# Patient Record
Sex: Female | Born: 2000 | Hispanic: Yes | Marital: Single | Smoking: Never smoker
Health system: Southern US, Community
[De-identification: ages and names within clinical notes are randomized; demographics above are authoritative.]

---
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
## 2023-09-18 ENCOUNTER — Encounter: Payer: Self-pay | Admitting: Adult Health

## 2023-09-21 ENCOUNTER — Ambulatory Visit (INDEPENDENT_AMBULATORY_CARE_PROVIDER_SITE_OTHER): Payer: Self-pay | Admitting: Oncology

## 2023-09-21 ENCOUNTER — Encounter: Payer: Self-pay | Admitting: Oncology

## 2023-09-21 ENCOUNTER — Other Ambulatory Visit: Payer: Self-pay

## 2023-09-21 VITALS — BP 122/66 | HR 103 | Temp 101.4°F | Resp 18 | Ht 68.0 in | Wt 210.0 lb

## 2023-09-21 DIAGNOSIS — Z23 Encounter for immunization: Secondary | ICD-10-CM

## 2023-09-21 DIAGNOSIS — Z029 Encounter for administrative examinations, unspecified: Secondary | ICD-10-CM

## 2023-09-21 NOTE — Progress Notes (Signed)
[facility] [address] [place], [place] [address] Phone: [phone] Fax: [phone]   Office Visit Note  Patient Name: Caitlin Warner  Date of [id number]  Med Rec number [id number]  Date of Service: 09/21/2023  Patient has no known allergies.  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   Not on any daily medication.   No recent hospitalization or surgeries.   NKDA.   No current concerns.   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 Wears braces.   Current Medication:  Outpatient Encounter Medications as of 09/21/2023  Medication Sig   ketoconazole (NIZORAL) 2 % shampoo Apply topically.   No facility-administered encounter medications on file as of 09/21/2023.   Medical History: History reviewed. No pertinent past medical history.   Vital Signs: BP 122/66   Pulse (!) 103   Temp (!) 101.4 F (38.6 C) (Tympanic)   Resp 18   Ht 5\' 8"  (1.727 m)   Wt 210 lb (95.3 kg)   SpO2 99%   BMI 31.93 kg/m   ROS: As per HPI.  All other pertinent ROS negative.     Review of Systems  Constitutional: Negative.   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   Physical Exam Constitutional:      General: She is not in acute distress.    Appearance: Normal appearance.  Neurological:     Mental Status: She is alert.     No results found for this or any previous visit (from the past 24 hour(s)).  Assessment/Plan: 1. Immunization due -Immunization due.  -Last when she was 12 per student.  -Tdap given in clinic. Discussed SE.   - Tdap vaccine greater than or equal to 7yo IM  2. Encounter for administrative examinations -No current concerns.  -No current medications.  - Paperwork filled out and will be scanned into EPIC.  3. 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   - ketoconazole (NIZORAL) 2 % shampoo; Apply topically.   General Counseling: makaria demain understanding of the findings of todays visit and agrees with plan of treatment. I have discussed any further diagnostic evaluation that may be needed or ordered today. We also reviewed her medications today. she has been encouraged to call the office with any questions or concerns that should arise related to todays visit.   Orders Placed This Encounter  Procedures   Tdap vaccine greater than or equal to 7yo IM   No orders of the defined types were placed in this encounter.  I spent 20 minutes dedicated to the care of this patient (face-to-face and non-face-to-face) on the date of the encounter to include what is described in the assessment and plan.   Durenda Hurt, NP 09/21/2023 1:37 PM

## 2024-06-09 ENCOUNTER — Telehealth: Admitting: Physician Assistant

## 2024-06-09 DIAGNOSIS — K921 Melena: Secondary | ICD-10-CM

## 2024-06-09 DIAGNOSIS — K219 Gastro-esophageal reflux disease without esophagitis: Secondary | ICD-10-CM

## 2024-06-09 NOTE — Progress Notes (Signed)
  Because of severity of heartburn and how long this has been going on, now with dark/black stools (which can be from hidden blood in the stool), I feel your condition warrants further evaluation and I recommend that you be seen in a face-to-face visit.   NOTE: There will be NO CHARGE for this E-Visit   If you are having a true medical emergency, please call 911.     For an urgent face to face visit, [facility] has multiple urgent care centers for your convenience.  Click the link below for the full list of locations and hours, walk-in wait times, appointment scheduling options and driving directions:  Urgent Care - [place], [place], [place], [place], [place], [place]  [place]     Your MyChart E-visit questionnaire answers were reviewed by a board certified advanced clinical practitioner to complete your personal care plan based on your specific symptoms.    Thank you for using e-Visits.

## 2024-06-10 ENCOUNTER — Ambulatory Visit: Admitting: Family Medicine

## 2024-06-10 ENCOUNTER — Encounter: Payer: Self-pay | Admitting: Family Medicine

## 2024-06-10 ENCOUNTER — Other Ambulatory Visit: Payer: Self-pay

## 2024-06-10 VITALS — BP 126/82 | HR 92 | Temp 97.6°F | Wt 215.0 lb

## 2024-06-10 DIAGNOSIS — Z8349 Family history of other endocrine, nutritional and metabolic diseases: Secondary | ICD-10-CM

## 2024-06-10 DIAGNOSIS — R0683 Snoring: Secondary | ICD-10-CM | POA: Diagnosis not present

## 2024-06-10 DIAGNOSIS — R5383 Other fatigue: Secondary | ICD-10-CM | POA: Diagnosis not present

## 2024-06-10 NOTE — Progress Notes (Signed)
   Assessment & Plan:  Patient ID: Caitlin Warner, female    DOB: 2001/04/25  Age: 23 y.o. MRN: [id number]  Problem List Items Addressed This Visit   None Visit Diagnoses       Other fatigue    -  Primary   Relevant Orders   CBC w/Diff   Ambulatory referral to Sleep Studies   Comprehensive metabolic panel     Snores       Relevant Orders   Ambulatory referral to Sleep Studies     Family history of thyroid disease in grandmother       Relevant Orders   TSH        Follow-up: Return if symptoms worsen or fail to improve.  Will reach out to student care team at student life  Labs pending I think she needs to get evaluated for possible OSA based on sxs  Subjective:    CC: The primary encounter diagnosis was Other fatigue. Diagnoses of Snores and Family history of thyroid disease in grandmother were also pertinent to this visit.  HPI Caitlin Warner presents for multiple things but overall she has not been feeling like herself in the last 2-3 weeks. She states she feels fatigue, she feels constipated, she feels her emotions are all over the place, mood changes, more aggressive on the road, she will be hot/cold but mostly hot, she also complains about not sleeping well. She snores at night. She has been trying to eat right and cooking for herself. Over the summer she was working and at home and everything seemed fine. She was seeing therapy for her anxiousness/stress but now is trying to find another counselor since her last one was in training and had to leave. Would like to get some of her labs checked. Family hx of diabetes but she was last checked in June and it was normal. She has a hx of Thyroid disease in the family. Lots of stressors in her life but this is not more than what is has been normally. 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  Still trying to get in with a counselor, she is seeing how her schedule settles out.   History Caitlin Warner has no past medical history on file.   She has no past surgical history on file.   Her family history is not on file.She reports that she has never smoked. She has never used smokeless tobacco. She reports that she does not drink alcohol and does not use drugs.  Outpatient Medications Prior to Visit  Medication Sig Dispense Refill   ketoconazole (NIZORAL) 2 % shampoo Apply topically.     No facility-administered medications prior to visit.    ROS per HPI  Objective:  BP 126/82   Pulse 92   Temp 97.6 F (36.4 C) (Tympanic)   Wt 215 lb (97.5 kg)   SpO2 98%   BMI 32.69 kg/m   Physical Exam GEN: A&O x 3, NAD, well nourished HEENT: Normocephalic, atraumatic, EOMI, OP patent, bilateral TMs normal, no LAD, no thyromegaly RESP: CTAB, no R/R/W CV: RRR, no M/R/G ABDOMEN: soft, nontender, normal BS, no HSM VAS: no Rosaisela Jamroz swelling NEURO: A&O x 3 MSK: normal gait, normal ROM UE and Jeet Shough PSYCH: normal speech, normal mood SKIN: no rashes or lesions   Emmanuelle Hibbitts Phuong Gabriella Guile, DO

## 2024-06-11 LAB — COMPREHENSIVE METABOLIC PANEL WITH GFR
ALT: 23 IU/L (ref 0–32)
ALT: 25 IU/L (ref 0–32)
AST: 22 IU/L (ref 0–40)
AST: 23 IU/L (ref 0–40)
Albumin: 4.6 g/dL (ref 4.0–5.0)
Albumin: 4.6 g/dL (ref 4.0–5.0)
Alkaline Phosphatase: 114 IU/L (ref 44–121)
Alkaline Phosphatase: 116 IU/L (ref 44–121)
BUN/Creatinine Ratio: 15 (ref 9–23)
BUN/Creatinine Ratio: 16 (ref 9–23)
BUN: 9 mg/dL (ref 6–20)
BUN: 9 mg/dL (ref 6–20)
Bilirubin Total: 0.4 mg/dL (ref 0.0–1.2)
Bilirubin Total: 0.4 mg/dL (ref 0.0–1.2)
CO2: 20 mmol/L (ref 20–29)
CO2: 20 mmol/L (ref 20–29)
Calcium: 9.4 mg/dL (ref 8.7–10.2)
Calcium: 9.5 mg/dL (ref 8.7–10.2)
Chloride: 100 mmol/L (ref 96–106)
Chloride: 101 mmol/L (ref 96–106)
Creatinine, Ser: 0.58 mg/dL (ref 0.57–1.00)
Creatinine, Ser: 0.59 mg/dL (ref 0.57–1.00)
Globulin, Total: 2.8 g/dL (ref 1.5–4.5)
Globulin, Total: 2.8 g/dL (ref 1.5–4.5)
Glucose: 87 mg/dL (ref 70–99)
Glucose: 87 mg/dL (ref 70–99)
Potassium: 4.1 mmol/L (ref 3.5–5.2)
Potassium: 4.2 mmol/L (ref 3.5–5.2)
Sodium: 139 mmol/L (ref 134–144)
Sodium: 139 mmol/L (ref 134–144)
Total Protein: 7.4 g/dL (ref 6.0–8.5)
Total Protein: 7.4 g/dL (ref 6.0–8.5)
eGFR: 131 mL/min/1.73 (ref >59)
eGFR: 132 mL/min/1.73 (ref >59)

## 2024-06-11 LAB — CBC WITH DIFFERENTIAL/PLATELET
Basophils Absolute: 0.1 x10E3/uL (ref 0.0–0.2)
Basos: 1 %
EOS (ABSOLUTE): 0.1 x10E3/uL (ref 0.0–0.4)
Eos: 1 %
Hematocrit: 40 % (ref 34.0–46.6)
Hemoglobin: 12.6 g/dL (ref 11.1–15.9)
Immature Grans (Abs): 0 x10E3/uL (ref 0.0–0.1)
Immature Granulocytes: 0 %
Lymphocytes Absolute: 2 x10E3/uL (ref 0.7–3.1)
Lymphs: 30 %
MCH: 24.7 pg — ABNORMAL LOW (ref 26.6–33.0)
MCHC: 31.5 g/dL (ref 31.5–35.7)
MCV: 78 fL — ABNORMAL LOW (ref 79–97)
Monocytes Absolute: 0.5 x10E3/uL (ref 0.1–0.9)
Monocytes: 8 %
Neutrophils Absolute: 4 x10E3/uL (ref 1.4–7.0)
Neutrophils: 60 %
Platelets: 374 x10E3/uL (ref 150–450)
RBC: 5.11 x10E6/uL (ref 3.77–5.28)
RDW: 14.7 % (ref 11.7–15.4)
WBC: 6.7 x10E3/uL (ref 3.4–10.8)

## 2024-06-11 LAB — TSH: TSH: 0.896 u[IU]/mL (ref 0.450–4.500)

## 2024-06-14 ENCOUNTER — Ambulatory Visit: Payer: Self-pay | Admitting: Family Medicine
